# Patient Record
Sex: Male | Born: 2005 | Race: White | Hispanic: No | Marital: Single | Smoking: Never smoker
Health system: Southern US, Community
[De-identification: ages and names within clinical notes are randomized; demographics above are authoritative.]

## PROBLEM LIST (undated history)

## (undated) DIAGNOSIS — L309 Dermatitis, unspecified: Secondary | ICD-10-CM

## (undated) DIAGNOSIS — J45909 Unspecified asthma, uncomplicated: Secondary | ICD-10-CM

## (undated) DIAGNOSIS — T7840XA Allergy, unspecified, initial encounter: Secondary | ICD-10-CM

## (undated) DIAGNOSIS — J020 Streptococcal pharyngitis: Secondary | ICD-10-CM

## (undated) DIAGNOSIS — R51 Headache: Secondary | ICD-10-CM

## (undated) DIAGNOSIS — N390 Urinary tract infection, site not specified: Secondary | ICD-10-CM

## (undated) HISTORY — PX: URETER SURGERY: SHX823

## (undated) HISTORY — DX: Urinary tract infection, site not specified: N39.0

---
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
## 2023-10-15 ENCOUNTER — Telehealth: Payer: Self-pay

## 2023-10-15 NOTE — Telephone Encounter (Signed)
 May order sickle cell screening through [facility] patient could get 1 drawn here and results usually back within a few days and can be forwarded via MyChart or printed out for mother and she can take it and fax it

## 2023-10-15 NOTE — Telephone Encounter (Signed)
 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 Patient moves in to dorm on Thursday 10/17/23, mom states they were just informed sickle cell test is needed. Mom inquiring on where they can have that done.        Requesting call back as soon as possible, [phone]

## 2023-10-16 ENCOUNTER — Other Ambulatory Visit: Payer: Self-pay

## 2023-10-16 DIAGNOSIS — Z130 Encounter for screening for diseases of the blood and blood-forming organs and certain disorders involving the immune mechanism: Secondary | ICD-10-CM

## 2023-10-16 NOTE — Telephone Encounter (Signed)
Ordered and mother informed

## 2023-10-17 ENCOUNTER — Telehealth: Payer: Self-pay | Admitting: Family Medicine

## 2023-10-17 ENCOUNTER — Ambulatory Visit: Payer: Self-pay | Admitting: Family Medicine

## 2023-10-17 LAB — CMP14+CBC/D/PLT+FER+RETIC+V...
ALT: 19 IU/L (ref 0–44)
AST: 15 IU/L (ref 0–40)
Albumin: 4.9 g/dL (ref 4.3–5.2)
Alkaline Phosphatase: 84 IU/L (ref 51–125)
BUN/Creatinine Ratio: 15 (ref 9–20)
BUN: 16 mg/dL (ref 6–20)
Basophils Absolute: 0.1 x10E3/uL (ref 0.0–0.2)
Basos: 1 %
Bilirubin Total: 0.4 mg/dL (ref 0.0–1.2)
CO2: 24 mmol/L (ref 20–29)
Calcium: 10.3 mg/dL — ABNORMAL HIGH (ref 8.7–10.2)
Chloride: 103 mmol/L (ref 96–106)
Creatinine, Ser: 1.1 mg/dL (ref 0.76–1.27)
EOS (ABSOLUTE): 0.1 x10E3/uL (ref 0.0–0.4)
Eos: 2 %
Ferritin: 90 ng/mL (ref 30–400)
Globulin, Total: 2.4 g/dL (ref 1.5–4.5)
Glucose: 94 mg/dL (ref 70–99)
Hematocrit: 47.1 % (ref 37.5–51.0)
Hemoglobin: 15.5 g/dL (ref 13.0–17.7)
Immature Grans (Abs): 0 x10E3/uL (ref 0.0–0.1)
Immature Granulocytes: 0 %
Lymphocytes Absolute: 1.8 x10E3/uL (ref 0.7–3.1)
Lymphs: 40 %
MCH: 29.4 pg (ref 26.6–33.0)
MCHC: 32.9 g/dL (ref 31.5–35.7)
MCV: 89 fL (ref 79–97)
Monocytes Absolute: 0.3 x10E3/uL (ref 0.1–0.9)
Monocytes: 7 %
Neutrophils Absolute: 2.1 x10E3/uL (ref 1.4–7.0)
Neutrophils: 50 %
Platelets: 194 x10E3/uL (ref 150–450)
Potassium: 5.3 mmol/L — ABNORMAL HIGH (ref 3.5–5.2)
RBC: 5.27 x10E6/uL (ref 4.14–5.80)
RDW: 13 % (ref 11.6–15.4)
Retic Ct Pct: 0.9 % (ref 0.6–2.6)
Sodium: 141 mmol/L (ref 134–144)
Total Protein: 7.3 g/dL (ref 6.0–8.5)
Vit D, 25-Hydroxy: 38.8 ng/mL (ref 30.0–100.0)
WBC: 4.3 x10E3/uL (ref 3.4–10.8)
eGFR: 99 mL/min/1.73 (ref >59)

## 2023-10-17 NOTE — Telephone Encounter (Signed)
 Hi Autumn  I ordered a sickle cell test to be done on this patient  Essentially a sickle cell panel got ordered we need a sickle cell screen (Under database it gives more choices including sickle cell screen) Please connect with [facility] to see if they can do the sickle cell screen on the blood they already have.  This needs to be run now This would be the correct test  If he needs to repeat the blood work or draw it again please connect with family and have them drawn again Otherwise get [facility] to run a sickle cell screen  Thank you  Patient needs this for his college he is going to play sports Please handle today thank you

## 2023-10-17 NOTE — Telephone Encounter (Signed)
 Contacted [facility] and added test as requested- they will pull specimen and see if they have what they need to run test and notify us  if they do not

## 2023-10-18 LAB — SPECIMEN STATUS REPORT

## 2023-10-18 NOTE — Progress Notes (Signed)
 Faxed and hand delivered to [facility]

## 2023-10-21 ENCOUNTER — Other Ambulatory Visit: Payer: Self-pay | Admitting: Family Medicine

## 2023-10-21 ENCOUNTER — Telehealth: Payer: Self-pay | Admitting: Family Medicine

## 2023-10-21 NOTE — Telephone Encounter (Signed)
 Please see other message Hemoglobin electrophoresis shows no presence of hemoglobin S Therefore no sickle cell Please notify mother Assist her with having a copy of this faxed to their college  If the college says they need additional testing then the college will need to specify what test and he may have to get it through our urgent care up that way-hopefully that is not the case

## 2023-10-21 NOTE — Telephone Encounter (Signed)
 Autumn  His hemoglobin electrophoresis shows no presence of hemoglobin S Therefore no sickle cell or sickle cell trait  Mom may have a copy If she needs a letter to go with this let us  know  If she needs it faxed to a particular number please assist Thanks-Dr. Glendia

## 2023-10-22 LAB — HGB FRACTIONATION CASCADE
Hgb A2: 2.8 % (ref 1.8–3.2)
Hgb A: 97.2 % (ref 96.4–98.8)
Hgb F: 0 % (ref 0.0–2.0)
Hgb S: 0 %

## 2023-10-22 LAB — SPECIMEN STATUS REPORT

## 2023-10-24 NOTE — Telephone Encounter (Signed)
 Patient has everything he needs and is good to go
